# Patient Record
Sex: Female | Born: 1955 | Race: Asian | Hispanic: No | Marital: Married | Smoking: Never smoker
Health system: Southern US, Community
[De-identification: ages and names within clinical notes are randomized; demographics above are authoritative.]

## PROBLEM LIST (undated history)

## (undated) DIAGNOSIS — I10 Essential (primary) hypertension: Secondary | ICD-10-CM

---

## 2021-03-07 ENCOUNTER — Other Ambulatory Visit: Payer: Self-pay

## 2021-03-07 ENCOUNTER — Ambulatory Visit (INDEPENDENT_AMBULATORY_CARE_PROVIDER_SITE_OTHER): Payer: 59 | Admitting: Family Medicine

## 2021-03-07 ENCOUNTER — Encounter: Payer: Self-pay | Admitting: Family Medicine

## 2021-03-07 VITALS — BP 175/95 | HR 54 | Temp 98.0°F | Resp 16 | Ht 62.0 in | Wt 119.8 lb

## 2021-03-07 DIAGNOSIS — I10 Essential (primary) hypertension: Secondary | ICD-10-CM | POA: Diagnosis not present

## 2021-03-07 DIAGNOSIS — Z789 Other specified health status: Secondary | ICD-10-CM | POA: Diagnosis not present

## 2021-03-07 DIAGNOSIS — Z7689 Persons encountering health services in other specified circumstances: Secondary | ICD-10-CM

## 2021-03-07 MED ORDER — LOSARTAN POTASSIUM 50 MG PO TABS: 50.0000 mg | ORAL_TABLET | Freq: Every day | ORAL | 0 refills | Status: DC

## 2021-03-07 NOTE — Progress Notes (Signed)
   New Patient Office Visit  Subjective:  Patient ID: Catherine Goodwin, female    DOB: [birth year]  Age: 65 y.o. MRN: [id number]  CC:  Chief Complaint  Patient presents with   Establish Care   Annual Exam    HPI Catherine Goodwin presents for to establish care.  Patient recently moved to [place] to live with son.  Denies acute complaints or concerns.  This visit was aided by interpreter.  History reviewed. No pertinent past medical history.  History reviewed. No pertinent surgical history.  History reviewed. No pertinent family history.  Social History   Socioeconomic History   Marital status: Married    Spouse name: Not on file   Number of children: Not on file   Years of education: Not on file   Highest education level: Not on file  Occupational History   Not on file  Tobacco Use   Smoking status: Not on file   Smokeless tobacco: Not on file  Substance and Sexual Activity   Alcohol use: Not on file   Drug use: Not on file   Sexual activity: Not on file  Other Topics Concern   Not on file  Social History Narrative   Not on file   Social Determinants of Health   Financial Resource Strain: Not on file  Food Insecurity: Not on file  Transportation Needs: Not on file  Physical Activity: Not on file  Stress: Not on file  Social Connections: Not on file  Intimate Partner Violence: Not on file    ROS Review of Systems  All other systems reviewed and are negative.  Objective:   Today's Vitals: BP (!) 175/95 (BP Location: Right Arm, Patient Position: Sitting, Cuff Size: Normal)   Pulse (!) 54   Temp 98 F (36.7 C) (Oral)   Resp 16   Ht _0  (1.575 m)   Wt 119 lb 12.8 oz (54.3 kg)   SpO2 98%   BMI 21.91 kg/m   Physical Exam Vitals and nursing note reviewed.  Constitutional:      General: She is not in acute distress. Cardiovascular:     Rate and Rhythm: Normal rate and regular rhythm.  Pulmonary:     Effort: Pulmonary effort is normal.     Breath  sounds: Normal breath sounds.  Abdominal:     Palpations: Abdomen is soft.     Tenderness: There is no abdominal tenderness.  Musculoskeletal:     Right lower leg: No edema.     Left lower leg: No edema.  Neurological:     General: No focal deficit present.     Mental Status: She is alert and oriented to person, place, and time.    Assessment & Plan:   1. Essential hypertension Elevated reading.  Losartan 50 mg p.o. daily prescribed.  Monitoring labs ordered.  Will monitor. - CMP14+EGFR - Lipid Panel  2. Encounter to establish care   3. Language barrier affecting health care     Outpatient Encounter Medications as of 03/07/2021  Medication Sig   losartan (COZAAR) 50 MG tablet Take 1 tablet (50 mg total) by mouth daily.   No facility-administered encounter medications on file as of 03/07/2021.    Follow-up: Return in about 4 weeks (around 04/04/2021) for follow up.   Becky Sax, MD

## 2021-03-08 LAB — CMP14+EGFR
ALT: 18 IU/L (ref 0–32)
AST: 22 IU/L (ref 0–40)
Albumin/Globulin Ratio: 1.6 (ref 1.2–2.2)
Albumin: 4.6 g/dL (ref 3.8–4.8)
Alkaline Phosphatase: 114 IU/L (ref 44–121)
BUN/Creatinine Ratio: 19 (ref 12–28)
BUN: 15 mg/dL (ref 8–27)
Bilirubin Total: 0.9 mg/dL (ref 0.0–1.2)
CO2: 20 mmol/L (ref 20–29)
Calcium: 9.4 mg/dL (ref 8.7–10.3)
Chloride: 105 mmol/L (ref 96–106)
Creatinine, Ser: 0.8 mg/dL (ref 0.57–1.00)
Globulin, Total: 2.9 g/dL (ref 1.5–4.5)
Glucose: 88 mg/dL (ref 70–99)
Potassium: 4.1 mmol/L (ref 3.5–5.2)
Sodium: 147 mmol/L — ABNORMAL HIGH (ref 134–144)
Total Protein: 7.5 g/dL (ref 6.0–8.5)
eGFR: 82 mL/min/{1.73_m2} (ref >59)

## 2021-03-08 LAB — LIPID PANEL
Chol/HDL Ratio: 3.1 ratio (ref 0.0–4.4)
Cholesterol, Total: 186 mg/dL (ref 100–199)
HDL: 60 mg/dL (ref >39)
LDL Chol Calc (NIH): 104 mg/dL — ABNORMAL HIGH (ref 0–99)
Triglycerides: 127 mg/dL (ref 0–149)
VLDL Cholesterol Cal: 22 mg/dL (ref 5–40)

## 2021-04-04 ENCOUNTER — Other Ambulatory Visit: Payer: Self-pay

## 2021-04-04 ENCOUNTER — Encounter: Payer: Self-pay | Admitting: Family Medicine

## 2021-04-04 ENCOUNTER — Ambulatory Visit: Payer: 59 | Admitting: Family Medicine

## 2021-04-04 VITALS — BP 150/91 | HR 55 | Temp 97.9°F | Resp 16 | Wt 120.6 lb

## 2021-04-04 DIAGNOSIS — I10 Essential (primary) hypertension: Secondary | ICD-10-CM

## 2021-04-04 DIAGNOSIS — Z789 Other specified health status: Secondary | ICD-10-CM

## 2021-04-04 MED ORDER — LOSARTAN POTASSIUM 100 MG PO TABS: 100.0000 mg | ORAL_TABLET | Freq: Every day | ORAL | 0 refills | Status: DC

## 2021-04-04 NOTE — Progress Notes (Signed)
Patient is here for follow-up HTN  Patient has no new concerns today

## 2021-04-04 NOTE — Progress Notes (Signed)
   Established Patient Office Visit  Subjective:  Patient ID: Catherine Goodwin, female    DOB: 1956/02/10  Age: 65 y.o. MRN: [id number]  CC:  Chief Complaint  Patient presents with   Follow-up    HPI [name] presents for follow up of hypertension. Patient denies acute complaints or concerns. The visit was aided by an interpreter.   No past medical history on file.  No past surgical history on file.  No family history on file.  Social History   Socioeconomic History   Marital status: Married    Spouse name: Not on file   Number of children: Not on file   Years of education: Not on file   Highest education level: Not on file  Occupational History   Not on file  Tobacco Use   Smoking status: Not on file   Smokeless tobacco: Not on file  Substance and Sexual Activity   Alcohol use: Not on file   Drug use: Not on file   Sexual activity: Not on file  Other Topics Concern   Not on file  Social History Narrative   Not on file   Social Determinants of Health   Financial Resource Strain: Not on file  Food Insecurity: Not on file  Transportation Needs: Not on file  Physical Activity: Not on file  Stress: Not on file  Social Connections: Not on file  Intimate Partner Violence: Not on file    ROS Review of Systems  All other systems reviewed and are negative.  Objective:   Today's Vitals: BP (!) 150/91   Pulse (!) 55   Temp 97.9 F (36.6 C) (Oral)   Resp 16   Wt 120 lb 9.6 oz (54.7 kg)   BMI 22.06 kg/m   Physical Exam Vitals and nursing note reviewed.  Constitutional:      General: She is not in acute distress. Cardiovascular:     Rate and Rhythm: Normal rate and regular rhythm.  Pulmonary:     Effort: Pulmonary effort is normal.     Breath sounds: Normal breath sounds.  Abdominal:     Palpations: Abdomen is soft.     Tenderness: There is no abdominal tenderness.  Musculoskeletal:     Right lower leg: No edema.     Left lower leg: No edema.   Neurological:     General: No focal deficit present.     Mental Status: She is alert and oriented to person, place, and time.    Assessment & Plan:   1. Essential hypertension Readings are improved but remain elevated. Will increase losartan from 50 mg daily to 100mg  daily and monitor.   2. Language barrier to communication     Outpatient Encounter Medications as of 04/04/2021  Medication Sig   losartan (COZAAR) 100 MG tablet Take 1 tablet (100 mg total) by mouth daily.   [DISCONTINUED] losartan (COZAAR) 50 MG tablet Take 1 tablet (50 mg total) by mouth daily.   No facility-administered encounter medications on file as of 04/04/2021.    Follow-up: Return in about 4 weeks (around 05/02/2021) for follow up.   05/04/2021, MD

## 2021-05-02 ENCOUNTER — Ambulatory Visit (INDEPENDENT_AMBULATORY_CARE_PROVIDER_SITE_OTHER): Payer: 59 | Admitting: Family Medicine

## 2021-05-02 ENCOUNTER — Other Ambulatory Visit: Payer: Self-pay

## 2021-05-02 ENCOUNTER — Encounter: Payer: Self-pay | Admitting: Family Medicine

## 2021-05-02 VITALS — BP 144/89 | HR 57 | Temp 98.0°F | Resp 16 | Wt 120.6 lb

## 2021-05-02 DIAGNOSIS — I10 Essential (primary) hypertension: Secondary | ICD-10-CM | POA: Diagnosis not present

## 2021-05-02 DIAGNOSIS — R42 Dizziness and giddiness: Secondary | ICD-10-CM

## 2021-05-02 DIAGNOSIS — Z789 Other specified health status: Secondary | ICD-10-CM

## 2021-05-02 DIAGNOSIS — R001 Bradycardia, unspecified: Secondary | ICD-10-CM

## 2021-05-02 NOTE — Progress Notes (Signed)
Patient son said mom has been feeling dizzy lately and would like to talk with provider about this. Patient son said that mom has been having heart discomfort but denies chest pain

## 2021-05-02 NOTE — Progress Notes (Signed)
   Established Patient Office Visit  Subjective:  Patient ID: Catherine Goodwin, female    DOB: 1955/12/29  Age: 65 y.o. MRN: [id number]  CC:  Chief Complaint  Patient presents with   Follow-up    HPI Catherine Goodwin presents for follow up of hypertension. Patient has also reports some intermittent "chest fullness" and some dizziness. This visit was aided by an interpreter.   No past medical history on file.  No past surgical history on file.  No family history on file.  Social History   Socioeconomic History   Marital status: Married    Spouse name: Not on file   Number of children: Not on file   Years of education: Not on file   Highest education level: Not on file  Occupational History   Not on file  Tobacco Use   Smoking status: Not on file   Smokeless tobacco: Not on file  Substance and Sexual Activity   Alcohol use: Not on file   Drug use: Not on file   Sexual activity: Not on file  Other Topics Concern   Not on file  Social History Narrative   Not on file   Social Determinants of Health   Financial Resource Strain: Not on file  Food Insecurity: Not on file  Transportation Needs: Not on file  Physical Activity: Not on file  Stress: Not on file  Social Connections: Not on file  Intimate Partner Violence: Not on file    ROS Review of Systems  Cardiovascular:  Negative for chest pain and leg swelling.  Neurological:  Positive for dizziness. Negative for syncope.  All other systems reviewed and are negative.  Objective:   Today's Vitals: BP (!) 144/89   Pulse (!) 57   Temp 98 F (36.7 C) (Oral)   Resp 16   Wt 120 lb 9.6 oz (54.7 kg)   SpO2 97%   BMI 22.06 kg/m   Physical Exam Vitals and nursing note reviewed.  Constitutional:      General: She is not in acute distress. Cardiovascular:     Rate and Rhythm: Normal rate and regular rhythm.  Pulmonary:     Effort: Pulmonary effort is normal.     Breath sounds: Normal breath sounds.  Abdominal:      Palpations: Abdomen is soft.     Tenderness: There is no abdominal tenderness.  Musculoskeletal:     Right lower leg: No edema.     Left lower leg: No edema.  Neurological:     General: No focal deficit present.     Mental Status: She is alert and oriented to person, place, and time.    Assessment & Plan:   1. Essential hypertension continue present management - referral to cardiology for further eval/mgt - Ambulatory referral to Cardiology  2. Bradycardia As above - Ambulatory referral to Cardiology  3. Dizziness and giddiness ? 2/2 to above - referral as referenced  4. Language barrier to communication     Outpatient Encounter Medications as of 05/02/2021  Medication Sig   losartan (COZAAR) 100 MG tablet Take 1 tablet (100 mg total) by mouth daily.   No facility-administered encounter medications on file as of 05/02/2021.    Follow-up: Return in about 4 weeks (around 05/30/2021) for follow up.   Tommie Raymond, MD

## 2021-05-09 ENCOUNTER — Emergency Department (HOSPITAL_BASED_OUTPATIENT_CLINIC_OR_DEPARTMENT_OTHER)
Admission: EM | Admit: 2021-05-09 | Discharge: 2021-05-10 | Disposition: A | Discharge status: Home / Self Care | Payer: 59 | Attending: Emergency Medicine | Admitting: Emergency Medicine

## 2021-05-09 ENCOUNTER — Ambulatory Visit
Admission: EM | Admit: 2021-05-09 | Discharge: 2021-05-09 | Disposition: A | Discharge status: Home / Self Care | Payer: 59 | Attending: Internal Medicine | Admitting: Internal Medicine

## 2021-05-09 ENCOUNTER — Encounter: Payer: Self-pay | Admitting: Emergency Medicine

## 2021-05-09 ENCOUNTER — Encounter (HOSPITAL_BASED_OUTPATIENT_CLINIC_OR_DEPARTMENT_OTHER): Payer: Self-pay | Admitting: Emergency Medicine

## 2021-05-09 ENCOUNTER — Other Ambulatory Visit: Payer: Self-pay

## 2021-05-09 DIAGNOSIS — R112 Nausea with vomiting, unspecified: Secondary | ICD-10-CM

## 2021-05-09 DIAGNOSIS — R42 Dizziness and giddiness: Secondary | ICD-10-CM

## 2021-05-09 DIAGNOSIS — I10 Essential (primary) hypertension: Secondary | ICD-10-CM | POA: Diagnosis not present

## 2021-05-09 DIAGNOSIS — Z79899 Other long term (current) drug therapy: Secondary | ICD-10-CM | POA: Diagnosis not present

## 2021-05-09 DIAGNOSIS — Z20822 Contact with and (suspected) exposure to covid-19: Secondary | ICD-10-CM | POA: Insufficient documentation

## 2021-05-09 HISTORY — DX: Essential (primary) hypertension: I10

## 2021-05-09 LAB — CBC
HCT: 40.9 % (ref 36.0–46.0)
Hemoglobin: 13.6 g/dL (ref 12.0–15.0)
MCH: 29 pg (ref 26.0–34.0)
MCHC: 33.3 g/dL (ref 30.0–36.0)
MCV: 87.2 fL (ref 80.0–100.0)
Platelets: 279 10*3/uL (ref 150–400)
RBC: 4.69 MIL/uL (ref 3.87–5.11)
RDW: 12.2 % (ref 11.5–15.5)
WBC: 6.7 10*3/uL (ref 4.0–10.5)
nRBC: 0 % (ref 0.0–0.2)

## 2021-05-09 LAB — BASIC METABOLIC PANEL
Anion gap: 8 (ref 5–15)
BUN: 14 mg/dL (ref 8–23)
CO2: 27 mmol/L (ref 22–32)
Calcium: 9.8 mg/dL (ref 8.9–10.3)
Chloride: 103 mmol/L (ref 98–111)
Creatinine, Ser: 0.63 mg/dL (ref 0.44–1.00)
GFR, Estimated: >60 mL/min (ref >60)
Glucose, Bld: 120 mg/dL — ABNORMAL HIGH (ref 70–99)
Potassium: 3.9 mmol/L (ref 3.5–5.1)
Sodium: 138 mmol/L (ref 135–145)

## 2021-05-09 LAB — RESP PANEL BY RT-PCR (FLU A&B, COVID) ARPGX2
Influenza A by PCR: NEGATIVE
Influenza B by PCR: NEGATIVE
SARS Coronavirus 2 by RT PCR: NEGATIVE

## 2021-05-09 LAB — POCT FASTING CBG KUC MANUAL ENTRY: POCT Glucose (KUC): 117 mg/dL — AB (ref 70–99)

## 2021-05-09 NOTE — Discharge Instructions (Signed)
Please go to the emergency department as soon as you leave urgent care for further evaluation and management. ?

## 2021-05-09 NOTE — ED Triage Notes (Signed)
Pt speaks mandarin - fujo dialect. Interpreter speaks mandarin.   Pt reports dizziness when she awakened this morning and then she began vomiting.   Pt is still feeling dizzy; last emesis was at 1600.

## 2021-05-09 NOTE — ED Provider Notes (Signed)
[facility]    CSN: [id number] Arrival date & time: 05/09/21  1521      History   Chief Complaint Chief Complaint  Patient presents with   Dizziness    HPI Catherine Goodwin is a 65 y.o. female.   Patient presents with dizziness, nausea, vomiting that started yesterday around 5 AM.  Dizziness is constant and there are no aggravating factors.  Patient denies any upper respiratory symptoms, diarrhea, constipation, fever, urinary burning, urinary frequency, abdominal pain, chest pain, shortness of breath.  Denies any known sick contacts.  Patient was seen on 05/02/2021 and reported dizziness to PCP.  PCP referred patient to cardiology but patient has not yet been seen by cardiologist.  Current medical history includes hypertension where patient takes Cozaar daily.   Dizziness  History reviewed. No pertinent past medical history.  There are no problems to display for this patient.   History reviewed. No pertinent surgical history.  OB History   No obstetric history on file.      Home Medications    Prior to Admission medications   Medication Sig Start Date End Date Taking? Authorizing Provider  losartan (COZAAR) 100 MG tablet Take 1 tablet (100 mg total) by mouth daily. 04/04/21   Georganna Skeans, MD    Family History History reviewed. No pertinent family history.  Social History Social History   Tobacco Use   Smoking status: Never   Smokeless tobacco: Never     Allergies   Patient has no known allergies.   Review of Systems Review of Systems Per HPI  Physical Exam Triage Vital Signs ED Triage Vitals  Enc Vitals Group     BP 05/09/21 1530 (!) 167/93     Pulse Rate 05/09/21 1530 61     Resp 05/09/21 1530 16     Temp 05/09/21 1530 98.6 F (37 C)     Temp Source 05/09/21 1530 Oral     SpO2 05/09/21 1530 93 %     Weight --      Height --      Head Circumference --      Peak Flow --      Pain Score 05/09/21 1533 0     Pain Loc --       Pain Edu? --      Excl. in GC? --    No data found.  Updated Vital Signs BP (!) 167/93 (BP Location: Right Arm)   Pulse 61   Temp 98.6 F (37 C) (Oral)   Resp 16   SpO2 93%   Visual Acuity Right Eye Distance:   Left Eye Distance:   Bilateral Distance:    Right Eye Near:   Left Eye Near:    Bilateral Near:     Physical Exam Constitutional:      General: She is not in acute distress.    Appearance: Normal appearance. She is not toxic-appearing or diaphoretic.  HENT:     Head: Normocephalic and atraumatic.     Right Ear: Tympanic membrane and ear canal normal.     Left Ear: Tympanic membrane and ear canal normal.     Nose: Nose normal.     Mouth/Throat:     Mouth: Mucous membranes are moist.  Eyes:     Extraocular Movements: Extraocular movements intact.     Conjunctiva/sclera: Conjunctivae normal.     Pupils: Pupils are equal, round, and reactive to light.  Cardiovascular:     Rate and Rhythm: Regular rhythm. Bradycardia  present.     Pulses: Normal pulses.     Heart sounds: Normal heart sounds.  Pulmonary:     Effort: Pulmonary effort is normal. No respiratory distress.     Breath sounds: Normal breath sounds.  Abdominal:     General: Bowel sounds are normal. There is no distension.     Palpations: Abdomen is soft.     Tenderness: There is no abdominal tenderness.  Skin:    General: Skin is warm and dry.  Neurological:     General: No focal deficit present.     Mental Status: She is alert and oriented to person, place, and time. Mental status is at baseline.     Cranial Nerves: Cranial nerves 2-12 are intact.     Sensory: Sensation is intact.     Motor: Motor function is intact.     Coordination: Coordination is intact.     Gait: Gait is intact.  Psychiatric:        Mood and Affect: Mood normal.        Behavior: Behavior normal.        Thought Content: Thought content normal.        Judgment: Judgment normal.     UC Treatments / Results  Labs (all  labs ordered are listed, but only abnormal results are displayed) Labs Reviewed  POCT FASTING CBG [facility] MANUAL ENTRY - Abnormal; Notable for the following components:      Result Value   POCT Glucose ([facility]) 117 (*)    All other components within normal limits    EKG   Radiology No results found.  Procedures Procedures (including critical care time)  Medications Ordered in UC Medications - No data to display  Initial Impression / Assessment and Plan / UC Course  I have reviewed the triage vital signs and the nursing notes.  Pertinent labs & imaging results that were available during my care of the patient were reviewed by me and considered in my medical decision making (see chart for details).     Differential diagnoses include bradycardia, vertigo, urinary tract infection.  EKG showing sinus rhythm with nonspecific ST and T wave abnormality.  No prior EKGs to compare to.  It would be best for patient to be further evaluated and managed at the hospital given EKG result to rule out cardiac cause of dizziness.  Will defer all other evaluation and management to the hospital.  Suggested EMS transport to family number and patient but they declined.  Patient and family member agreed to family member transporting patient to the hospital.  Interpreter used throughout patient interaction. Final Clinical Impressions(s) / UC Diagnoses   Final diagnoses:  Dizziness and giddiness  Nausea and vomiting, unspecified vomiting type     Discharge Instructions      Please go to the emergency department as soon as you leave urgent care for further evaluation and management.    ED Prescriptions   None    PDMP not reviewed this encounter.   Gustavus Bryant, [place] 05/09/21 [phone]

## 2021-05-09 NOTE — ED Triage Notes (Addendum)
Dizziness, nausea, vomiting, mild ringing in ears starting yesterday at 5 pm. Reports history of heart problems. Denies pain, fever, diarrhea, headache, heart problems

## 2021-05-10 ENCOUNTER — Emergency Department (HOSPITAL_BASED_OUTPATIENT_CLINIC_OR_DEPARTMENT_OTHER): Payer: 59

## 2021-05-10 ENCOUNTER — Emergency Department (HOSPITAL_COMMUNITY): Payer: 59

## 2021-05-10 ENCOUNTER — Encounter (HOSPITAL_BASED_OUTPATIENT_CLINIC_OR_DEPARTMENT_OTHER): Payer: Self-pay

## 2021-05-10 DIAGNOSIS — Z20822 Contact with and (suspected) exposure to covid-19: Secondary | ICD-10-CM | POA: Diagnosis not present

## 2021-05-10 DIAGNOSIS — R112 Nausea with vomiting, unspecified: Secondary | ICD-10-CM | POA: Diagnosis not present

## 2021-05-10 DIAGNOSIS — I10 Essential (primary) hypertension: Secondary | ICD-10-CM | POA: Diagnosis not present

## 2021-05-10 DIAGNOSIS — Z79899 Other long term (current) drug therapy: Secondary | ICD-10-CM | POA: Diagnosis not present

## 2021-05-10 DIAGNOSIS — R42 Dizziness and giddiness: Secondary | ICD-10-CM | POA: Diagnosis present

## 2021-05-10 LAB — TROPONIN I (HIGH SENSITIVITY)
Troponin I (High Sensitivity): 4 ng/L (ref <18)
Troponin I (High Sensitivity): 4 ng/L (ref <18)

## 2021-05-10 LAB — URINALYSIS, ROUTINE W REFLEX MICROSCOPIC
Bilirubin Urine: NEGATIVE
Glucose, UA: NEGATIVE mg/dL
Hgb urine dipstick: NEGATIVE
Ketones, ur: 15 mg/dL — AB
Leukocytes,Ua: NEGATIVE
Nitrite: NEGATIVE
Specific Gravity, Urine: 1.015 (ref 1.005–1.030)
pH: 6.5 (ref 5.0–8.0)

## 2021-05-10 MED ORDER — MECLIZINE HCL 25 MG PO TABS: 25.0000 mg | ORAL_TABLET | Freq: Three times a day (TID) | ORAL | 0 refills | Status: DC | PRN

## 2021-05-10 MED ORDER — MECLIZINE HCL 25 MG PO TABS
ORAL_TABLET | ORAL | Status: AC
  Filled 2021-05-10: qty 1

## 2021-05-10 MED ORDER — IOHEXOL 350 MG/ML SOLN
80.0000 mL | Freq: Once | INTRAVENOUS | Status: AC | PRN
  Administered 2021-05-10: 80 mL via INTRAVENOUS

## 2021-05-10 MED ORDER — MECLIZINE HCL 25 MG PO TABS
25.0000 mg | ORAL_TABLET | Freq: Once | ORAL | Status: AC
  Administered 2021-05-10: 25 mg via ORAL

## 2021-05-10 NOTE — ED Notes (Signed)
RN called MRI again to check on scan status. MRI to sent transport now per MRI.

## 2021-05-10 NOTE — ED Notes (Signed)
RN requested update about timing of MRI. Per MRI, pt will hopefully go in the next hour.

## 2021-05-10 NOTE — ED Notes (Signed)
Son called about pt pending d/c per PA Greta Doom.

## 2021-05-10 NOTE — ED Notes (Signed)
Pt ambulated to RR with minimal assistance 

## 2021-05-10 NOTE — ED Notes (Signed)
Patient verbalizes understanding of discharge instructions. Opportunity for questioning and answers were provided. Armband removed by staff, pt discharged from ED via wheelchair to lobby to await family to go home.   

## 2021-05-10 NOTE — ED Notes (Signed)
Pt transfer from [facility]. Pt was having dizziness with vomiting. Pt last vomited at 1600. Pt had meclizine at [facility], which helped a little. Pt here for MRI.   180/97 62 heart rate 98% room air

## 2021-05-10 NOTE — ED Notes (Signed)
[id number] interpreter for IV start and blood draw

## 2021-05-10 NOTE — Discharge Instructions (Addendum)
Your dizziness is likely due to a conditional called Benign Paroxysmal Positional Vertigo (BPPV). Please take meclizine as prescribed for dizziness.  Follow up closely with your primary care provider for further management.  You will likely benefit from vestibular rehab such as following the Epley Maneuver (you can search for this on YouTube).  Discuss with your doctor for further recommendation.  No evidence of stroke on today's exam.    ???????????????????????? (BPPV) ???????????????meclizine???????????????????????????????????????? Epley Maneuver????? YouTube ??????????????????????????????????? Nn de tuy?n h?n k?nng sh yuy y? zh?ng ch?ng wi lingxng zhn f? xng wizh xng xuny?n (BPPV) de bngzhng y?nq? de. Tuy?n q?ng n ch?f?ng fyng meclizine. Y? nn de ch?j b?ojin tg?ng zh? mqi g?n jn y? jnxng jny?b gu?nl?. Nn k?nng hu shuy y qintng k?ngf, lr z?nxn Epley Maneuver(nn k?y? zi YouTube shng s?usu?). Y? nn de y?sh?ng t?oln y? hud jny?b de jiny. J?nti?n de ji?nch miy?u zhngf?ng de j?xing.

## 2021-05-10 NOTE — ED Notes (Signed)
Pt transported to MRI 

## 2021-05-10 NOTE — ED Provider Notes (Signed)
[facility] EMERGENCY DEPT Provider Note   CSN: [id number] Arrival date & time: 05/09/21  1748     History Chief Complaint  Patient presents with   Dizziness   Emesis    Catherine Goodwin is a 65 y.o. female.  The history is provided by the patient and a relative. The history is limited by a language barrier. A language interpreter was used.  Dizziness Quality:  Head spinning Severity:  Moderate Onset quality:  Gradual Duration:  1 day Timing:  Constant Progression:  Worsening Chronicity:  New Context: not with head movement and not with loss of consciousness   Relieved by:  Nothing Worsened by:  Nothing Ineffective treatments:  None tried Associated symptoms: nausea and vomiting   Associated symptoms: no diarrhea and no weakness   Risk factors: no hx of vertigo   Emesis Severity:  Mild Timing:  Rare Quality:  Stomach contents Progression:  Resolved Chronicity:  New Recent urination:  Normal Relieved by:  Nothing Worsened by:  Nothing Ineffective treatments:  None tried Associated symptoms: no abdominal pain, no diarrhea and no fever   Risk factors: no alcohol use       Past Medical History:  Diagnosis Date   Hypertension     There are no problems to display for this patient.   History reviewed. No pertinent surgical history.   OB History   No obstetric history on file.     History reviewed. No pertinent family history.  Social History   Tobacco Use   Smoking status: Never   Smokeless tobacco: Never  Vaping Use   Vaping Use: Never used  Substance Use Topics   Alcohol use: Never   Drug use: Never    Home Medications Prior to Admission medications   Medication Sig Start Date End Date Taking? Authorizing Provider  losartan (COZAAR) 100 MG tablet Take 1 tablet (100 mg total) by mouth daily. 04/04/21   Georganna Skeans, MD    Allergies    Patient has no known allergies.  Review of Systems   Review of Systems  Constitutional:   Negative for fever.  HENT:  Negative for facial swelling.   Eyes:  Negative for redness.  Respiratory:  Negative for wheezing and stridor.   Cardiovascular:  Negative for leg swelling.  Gastrointestinal:  Positive for nausea and vomiting. Negative for abdominal pain and diarrhea.  Genitourinary:  Negative for difficulty urinating.  Musculoskeletal:  Negative for neck pain.  Skin:  Negative for rash.  Neurological:  Positive for dizziness. Negative for weakness.  Psychiatric/Behavioral:  Negative for agitation.   All other systems reviewed and are negative.  Physical Exam Updated Vital Signs BP (!) 175/84   Pulse (!) 54   Temp 98.7 F (37.1 C) (Oral)   Resp 20   Ht 5' (1.524 m)   Wt 57.2 kg   SpO2 100%   BMI 24.61 kg/m   Physical Exam Vitals and nursing note reviewed.  Constitutional:      General: She is not in acute distress.    Appearance: Normal appearance. She is not diaphoretic.  HENT:     Head: Normocephalic and atraumatic.     Nose: Nose normal.     Mouth/Throat:     Mouth: Mucous membranes are moist.  Eyes:     Extraocular Movements: Extraocular movements intact.     Conjunctiva/sclera: Conjunctivae normal.     Pupils: Pupils are equal, round, and reactive to light.  Cardiovascular:     Rate and Rhythm: Normal  rate and regular rhythm.     Pulses: Normal pulses.     Heart sounds: Normal heart sounds.  Pulmonary:     Effort: Pulmonary effort is normal.     Breath sounds: Normal breath sounds.  Abdominal:     General: Abdomen is flat. Bowel sounds are normal.     Palpations: Abdomen is soft.     Tenderness: There is no abdominal tenderness. There is no guarding.  Musculoskeletal:        General: Normal range of motion.     Cervical back: Normal range of motion and neck supple.  Skin:    General: Skin is warm and dry.     Capillary Refill: Capillary refill takes less than 2 seconds.  Neurological:     Mental Status: She is alert.     Cranial Nerves: No  cranial nerve deficit.     Deep Tendon Reflexes: Reflexes normal.  Psychiatric:        Mood and Affect: Mood normal.    ED Results / Procedures / Treatments   Labs (all labs ordered are listed, but only abnormal results are displayed) Results for orders placed or performed during the hospital encounter of 05/09/21  Resp Panel by RT-PCR (Flu A&B, Covid) Nasopharyngeal Swab   Specimen: Nasopharyngeal Swab; Nasopharyngeal(NP) swabs in vial transport medium  Result Value Ref Range   SARS Coronavirus 2 by RT PCR NEGATIVE NEGATIVE   Influenza A by PCR NEGATIVE NEGATIVE   Influenza B by PCR NEGATIVE NEGATIVE  Basic metabolic panel  Result Value Ref Range   Sodium 138 135 - 145 mmol/L   Potassium 3.9 3.5 - 5.1 mmol/L   Chloride 103 98 - 111 mmol/L   CO2 27 22 - 32 mmol/L   Glucose, Bld 120 (H) 70 - 99 mg/dL   BUN 14 8 - 23 mg/dL   Creatinine, Ser 1.93 0.44 - 1.00 mg/dL   Calcium 9.8 8.9 - 79.0 mg/dL   GFR, Estimated >24 >09 mL/min   Anion gap 8 5 - 15  CBC  Result Value Ref Range   WBC 6.7 4.0 - 10.5 K/uL   RBC 4.69 3.87 - 5.11 MIL/uL   Hemoglobin 13.6 12.0 - 15.0 g/dL   HCT 73.5 32.9 - 92.4 %   MCV 87.2 80.0 - 100.0 fL   MCH 29.0 26.0 - 34.0 pg   MCHC 33.3 30.0 - 36.0 g/dL   RDW 26.8 34.1 - 96.2 %   Platelets 279 150 - 400 K/uL   nRBC 0.0 0.0 - 0.2 %  Urinalysis, Routine w reflex microscopic  Result Value Ref Range   Color, Urine YELLOW YELLOW   APPearance CLEAR CLEAR   Specific Gravity, Urine 1.015 1.005 - 1.030   pH 6.5 5.0 - 8.0   Glucose, UA NEGATIVE NEGATIVE mg/dL   Hgb urine dipstick NEGATIVE NEGATIVE   Bilirubin Urine NEGATIVE NEGATIVE   Ketones, ur 15 (A) NEGATIVE mg/dL   Protein, ur TRACE (A) NEGATIVE mg/dL   Nitrite NEGATIVE NEGATIVE   Leukocytes,Ua NEGATIVE NEGATIVE  Troponin I (High Sensitivity)  Result Value Ref Range   Troponin I (High Sensitivity) 4 <18 ng/L   CT ANGIO HEAD NECK W WO CM  Result Date: 05/10/2021 CLINICAL DATA:  Trauma EXAM: CT  ANGIOGRAPHY HEAD AND NECK TECHNIQUE: Multidetector CT imaging of the head and neck was performed using the standard protocol during bolus administration of intravenous contrast. Multiplanar CT image reconstructions and MIPs were obtained to evaluate the vascular anatomy. Carotid stenosis  measurements (when applicable) are obtained utilizing NASCET criteria, using the distal internal carotid diameter as the denominator. CONTRAST:  38mL OMNIPAQUE IOHEXOL 350 MG/ML SOLN COMPARISON:  None. FINDINGS: CT HEAD FINDINGS Brain: There is no mass, hemorrhage or extra-axial collection. The size and configuration of the ventricles and extra-axial CSF spaces are normal. There is no acute or chronic infarction. The brain parenchyma is normal. Skull: The visualized skull base, calvarium and extracranial soft tissues are normal. Sinuses/Orbits: No fluid levels or advanced mucosal thickening of the visualized paranasal sinuses. No mastoid or middle ear effusion. The orbits are normal. CTA NECK FINDINGS SKELETON: There is no bony spinal canal stenosis. No lytic or blastic lesion. OTHER NECK: Normal pharynx, larynx and major salivary glands. No cervical lymphadenopathy. Unremarkable thyroid gland. UPPER CHEST: No pneumothorax or pleural effusion. No nodules or masses. AORTIC ARCH: There is no calcific atherosclerosis of the aortic arch. There is no aneurysm, dissection or hemodynamically significant stenosis of the visualized portion of the aorta. Conventional 3 vessel aortic branching pattern. The visualized proximal subclavian arteries are widely patent. RIGHT CAROTID SYSTEM: Normal without aneurysm, dissection or stenosis. LEFT CAROTID SYSTEM: Normal without aneurysm, dissection or stenosis. VERTEBRAL ARTERIES: Left dominant configuration. Both origins are clearly patent. There is no dissection, occlusion or flow-limiting stenosis to the skull base (V1-V3 segments). CTA HEAD FINDINGS POSTERIOR CIRCULATION: --Vertebral arteries:  Normal V4 segments. --Inferior cerebellar arteries: Normal. --Basilar artery: Normal. --Superior cerebellar arteries: Normal. --Posterior cerebral arteries (PCA): Normal. ANTERIOR CIRCULATION: --Intracranial internal carotid arteries: Normal. --Anterior cerebral arteries (ACA): Normal. Both A1 segments are present. Patent anterior communicating artery (a-comm). --Middle cerebral arteries (MCA): Normal. VENOUS SINUSES: As permitted by contrast timing, patent. ANATOMIC VARIANTS: None Review of the MIP images confirms the above findings. IMPRESSION: Normal CTA of the head and neck. Electronically Signed   By: Deatra Robinson M.D.   On: 05/10/2021 01:05    EKG None  Radiology CT ANGIO HEAD NECK W WO CM  Result Date: 05/10/2021 CLINICAL DATA:  Trauma EXAM: CT ANGIOGRAPHY HEAD AND NECK TECHNIQUE: Multidetector CT imaging of the head and neck was performed using the standard protocol during bolus administration of intravenous contrast. Multiplanar CT image reconstructions and MIPs were obtained to evaluate the vascular anatomy. Carotid stenosis measurements (when applicable) are obtained utilizing NASCET criteria, using the distal internal carotid diameter as the denominator. CONTRAST:  19mL OMNIPAQUE IOHEXOL 350 MG/ML SOLN COMPARISON:  None. FINDINGS: CT HEAD FINDINGS Brain: There is no mass, hemorrhage or extra-axial collection. The size and configuration of the ventricles and extra-axial CSF spaces are normal. There is no acute or chronic infarction. The brain parenchyma is normal. Skull: The visualized skull base, calvarium and extracranial soft tissues are normal. Sinuses/Orbits: No fluid levels or advanced mucosal thickening of the visualized paranasal sinuses. No mastoid or middle ear effusion. The orbits are normal. CTA NECK FINDINGS SKELETON: There is no bony spinal canal stenosis. No lytic or blastic lesion. OTHER NECK: Normal pharynx, larynx and major salivary glands. No cervical lymphadenopathy.  Unremarkable thyroid gland. UPPER CHEST: No pneumothorax or pleural effusion. No nodules or masses. AORTIC ARCH: There is no calcific atherosclerosis of the aortic arch. There is no aneurysm, dissection or hemodynamically significant stenosis of the visualized portion of the aorta. Conventional 3 vessel aortic branching pattern. The visualized proximal subclavian arteries are widely patent. RIGHT CAROTID SYSTEM: Normal without aneurysm, dissection or stenosis. LEFT CAROTID SYSTEM: Normal without aneurysm, dissection or stenosis. VERTEBRAL ARTERIES: Left dominant configuration. Both origins are clearly patent. There  is no dissection, occlusion or flow-limiting stenosis to the skull base (V1-V3 segments). CTA HEAD FINDINGS POSTERIOR CIRCULATION: --Vertebral arteries: Normal V4 segments. --Inferior cerebellar arteries: Normal. --Basilar artery: Normal. --Superior cerebellar arteries: Normal. --Posterior cerebral arteries (PCA): Normal. ANTERIOR CIRCULATION: --Intracranial internal carotid arteries: Normal. --Anterior cerebral arteries (ACA): Normal. Both A1 segments are present. Patent anterior communicating artery (a-comm). --Middle cerebral arteries (MCA): Normal. VENOUS SINUSES: As permitted by contrast timing, patent. ANATOMIC VARIANTS: None Review of the MIP images confirms the above findings. IMPRESSION: Normal CTA of the head and neck. Electronically Signed   By: Deatra Robinson M.D.   On: 05/10/2021 01:05    Procedures Procedures   Medications Ordered in ED Medications  meclizine (ANTIVERT) tablet 25 mg (25 mg Oral Given 05/10/21 0009)  iohexol (OMNIPAQUE) 350 MG/ML injection 80 mL (80 mLs Intravenous Contrast Given 05/10/21 0032)    ED Course  I have reviewed the triage vital signs and the nursing notes.  Pertinent labs & imaging results that were available during my care of the patient were reviewed by me and considered in my medical decision making (see chart for details).  138 case d/w Dr.  Wilford Corner, will need MRI.  If negative d/c with vestibular rehab.  If positive, only then needs admission  Accepted by [facility] to the ED for MRI  MRI has been ordered  Final Clinical Impression(s) / ED Diagnoses Final diagnoses:  None   To [facility] for MRI Rx / DC Orders ED Discharge Orders     None        Money Mckeithan, MD 05/10/21 5427

## 2021-05-10 NOTE — ED Provider Notes (Signed)
Received signout from previous providers, please see her note for complete H&P.  This is a 65 year old Asian female presenting with dizziness.  Neurology was consulted, brain MRI was ordered.  Fortunately MRI did not show any concerning finding.  Patient able to ambulate several times throughout the ER visit with improvement of her symptoms.  Suspect BPPV causing her symptoms, will discharge home with vestibular rehab, and meclizine for symptom control.  Outpatient follow-up recommended.  Information was discussed using a language interpreter.    BP (!) 135/93   Pulse 63   Temp 99 F (37.2 C) (Oral)   Resp 16   Ht 5' (1.524 m)   Wt 57.2 kg   SpO2 97%   BMI 24.61 kg/m   Results for orders placed or performed during the hospital encounter of 05/09/21  Resp Panel by RT-PCR (Flu A&B, Covid) Nasopharyngeal Swab   Specimen: Nasopharyngeal Swab; Nasopharyngeal(NP) swabs in vial transport medium  Result Value Ref Range   SARS Coronavirus 2 by RT PCR NEGATIVE NEGATIVE   Influenza A by PCR NEGATIVE NEGATIVE   Influenza B by PCR NEGATIVE NEGATIVE  Basic metabolic panel  Result Value Ref Range   Sodium 138 135 - 145 mmol/L   Potassium 3.9 3.5 - 5.1 mmol/L   Chloride 103 98 - 111 mmol/L   CO2 27 22 - 32 mmol/L   Glucose, Bld 120 (H) 70 - 99 mg/dL   BUN 14 8 - 23 mg/dL   Creatinine, Ser 5.36 0.44 - 1.00 mg/dL   Calcium 9.8 8.9 - 14.4 mg/dL   GFR, Estimated >31 >54 mL/min   Anion gap 8 5 - 15  CBC  Result Value Ref Range   WBC 6.7 4.0 - 10.5 K/uL   RBC 4.69 3.87 - 5.11 MIL/uL   Hemoglobin 13.6 12.0 - 15.0 g/dL   HCT 00.8 67.6 - 19.5 %   MCV 87.2 80.0 - 100.0 fL   MCH 29.0 26.0 - 34.0 pg   MCHC 33.3 30.0 - 36.0 g/dL   RDW 09.3 26.7 - 12.4 %   Platelets 279 150 - 400 K/uL   nRBC 0.0 0.0 - 0.2 %  Urinalysis, Routine w reflex microscopic  Result Value Ref Range   Color, Urine YELLOW YELLOW   APPearance CLEAR CLEAR   Specific Gravity, Urine 1.015 1.005 - 1.030   pH 6.5 5.0 - 8.0    Glucose, UA NEGATIVE NEGATIVE mg/dL   Hgb urine dipstick NEGATIVE NEGATIVE   Bilirubin Urine NEGATIVE NEGATIVE   Ketones, ur 15 (A) NEGATIVE mg/dL   Protein, ur TRACE (A) NEGATIVE mg/dL   Nitrite NEGATIVE NEGATIVE   Leukocytes,Ua NEGATIVE NEGATIVE  Troponin I (High Sensitivity)  Result Value Ref Range   Troponin I (High Sensitivity) 4 <18 ng/L  Troponin I (High Sensitivity)  Result Value Ref Range   Troponin I (High Sensitivity) 4 <18 ng/L   CT ANGIO HEAD NECK W WO CM  Result Date: 05/10/2021 CLINICAL DATA:  Trauma EXAM: CT ANGIOGRAPHY HEAD AND NECK TECHNIQUE: Multidetector CT imaging of the head and neck was performed using the standard protocol during bolus administration of intravenous contrast. Multiplanar CT image reconstructions and MIPs were obtained to evaluate the vascular anatomy. Carotid stenosis measurements (when applicable) are obtained utilizing NASCET criteria, using the distal internal carotid diameter as the denominator. CONTRAST:  22mL OMNIPAQUE IOHEXOL 350 MG/ML SOLN COMPARISON:  None. FINDINGS: CT HEAD FINDINGS Brain: There is no mass, hemorrhage or extra-axial collection. The size and configuration of the ventricles  and extra-axial CSF spaces are normal. There is no acute or chronic infarction. The brain parenchyma is normal. Skull: The visualized skull base, calvarium and extracranial soft tissues are normal. Sinuses/Orbits: No fluid levels or advanced mucosal thickening of the visualized paranasal sinuses. No mastoid or middle ear effusion. The orbits are normal. CTA NECK FINDINGS SKELETON: There is no bony spinal canal stenosis. No lytic or blastic lesion. OTHER NECK: Normal pharynx, larynx and major salivary glands. No cervical lymphadenopathy. Unremarkable thyroid gland. UPPER CHEST: No pneumothorax or pleural effusion. No nodules or masses. AORTIC ARCH: There is no calcific atherosclerosis of the aortic arch. There is no aneurysm, dissection or hemodynamically significant  stenosis of the visualized portion of the aorta. Conventional 3 vessel aortic branching pattern. The visualized proximal subclavian arteries are widely patent. RIGHT CAROTID SYSTEM: Normal without aneurysm, dissection or stenosis. LEFT CAROTID SYSTEM: Normal without aneurysm, dissection or stenosis. VERTEBRAL ARTERIES: Left dominant configuration. Both origins are clearly patent. There is no dissection, occlusion or flow-limiting stenosis to the skull base (V1-V3 segments). CTA HEAD FINDINGS POSTERIOR CIRCULATION: --Vertebral arteries: Normal V4 segments. --Inferior cerebellar arteries: Normal. --Basilar artery: Normal. --Superior cerebellar arteries: Normal. --Posterior cerebral arteries (PCA): Normal. ANTERIOR CIRCULATION: --Intracranial internal carotid arteries: Normal. --Anterior cerebral arteries (ACA): Normal. Both A1 segments are present. Patent anterior communicating artery (a-comm). --Middle cerebral arteries (MCA): Normal. VENOUS SINUSES: As permitted by contrast timing, patent. ANATOMIC VARIANTS: None Review of the MIP images confirms the above findings. IMPRESSION: Normal CTA of the head and neck. Electronically Signed   By: Deatra Robinson M.D.   On: 05/10/2021 01:05   MR BRAIN WO CONTRAST  Result Date: 05/10/2021 CLINICAL DATA:  Neuro deficit, stroke suspected EXAM: MRI HEAD WITHOUT CONTRAST TECHNIQUE: Multiplanar, multiecho pulse sequences of the brain and surrounding structures were obtained without intravenous contrast. COMPARISON:  Same-day CT/CTA head and neck FINDINGS: Brain: There is no evidence of acute intracranial hemorrhage, extra-axial fluid collection, or acute infarct. Parenchymal volume is normal. The ventricles are normal in size. There are scattered foci of FLAIR signal abnormality in the subcortical and periventricular white matter likely reflecting sequela of mild chronic white matter microangiopathy. There is no suspicious parenchymal signal abnormality. There is no solid mass  lesion.  There is no midline shift. Vascular: Normal flow voids. Skull and upper cervical spine: Normal marrow signal. Sinuses/Orbits: There is mild mucosal thickening in the paranasal sinuses. The globes and orbits are unremarkable. Other: None. IMPRESSION: 1. No acute intracranial pathology. 2. Mild chronic white matter microangiopathy. Electronically Signed   By: Lesia Hausen M.D.   On: 05/10/2021 11:56      Fayrene Helper, PA-C 05/10/21 1403    Cathren Laine, MD 05/16/21 1010

## 2021-05-10 NOTE — ED Provider Notes (Signed)
3:11 AM Patient arrived to the ED from [facility] for further evaluation of dizziness.  MRI ordered to exclude central cause of vertigo.  6:19 AM Patient continues to pend MR imaging for further evaluation. Care signed out to [employer], PA-C at shift change.   Antony Madura, PA-C 05/10/21 [id number]    Nira Conn, MD 05/11/21 [id number]

## 2021-05-19 NOTE — Progress Notes (Deleted)
Cardiology Office Note:    Date:  05/19/2021   ID:  Catherine Goodwin, DOB [birth year], MRN [id number]  PCP:  Georganna Skeans, MD   [facility] Providers Cardiologist:  Alverda Skeans, MD Referring MD: Georganna Skeans, MD   Chief Complaint/Reason for Referral:  Hypertension and bradycardia    ASSESSMENT & PLAN:    Hypertension, unspecified type Would continue losartan and uptitrate this as needed.  I would avoid rate controlling agents.  Her sinus bradycardia is not causing her symptoms.  Certainly she may have an increased vagal tone given her nausea and vomiting which may be contributing.  Therefore vestibular rehabilitation and antinausea medications may be helpful in evaluating.  We will obtain an echocardiogram and keep follow-up with me open-ended.        {Are you ordering a CV Procedure (e.g. stress test, cath, DCCV, TEE, etc)?   Press F2        :[id number]}   Dispo:  No follow-ups on file.      Medication Adjustments/Labs and Tests Ordered: Current medicines are reviewed at length with the patient today.  Concerns regarding medicines are outlined above.    Tests Ordered: No orders of the defined types were placed in this encounter.    Medication Changes: No orders of the defined types were placed in this encounter.    History of Present Illness:    The patient is a 65 y.o. female with the indicated medical history here for recommendations regarding hypertension and bradycardia.  The patient was seen by her new primary care provider in November.  At that point in time her heart rate was 57 and her blood pressure was in the 140s.  She was seen in the emergency department shortly thereafter with dizziness, nausea, and vomiting..  She had a complete work-up including CT an MRI that were negative.  Vestibular rehab dilatation was recommended.      Previous Medical History: Past Medical History:  Diagnosis Date   Hypertension      Current Medications: No outpatient  medications have been marked as taking for the 05/20/21 encounter (Appointment) with Orbie Pyo, MD.     Allergies:    Patient has no known allergies.   Social History:   Social History   Tobacco Use   Smoking status: Never   Smokeless tobacco: Never  Vaping Use   Vaping Use: Never used  Substance Use Topics   Alcohol use: Never   Drug use: Never     Family Hx: No family history on file.   Review of Systems:   Please see the history of present illness.    All other systems reviewed and are negative.  EKGs/Labs/Other Test Reviewed:    EKG:  EKG today:***; prior EKG: Sinus bradycardia  Prior CV studies: Not yet performed  Imaging studies that I have independently reviewed today: CT and MRI  Recent Labs: 03/07/2021: ALT 18 05/09/2021: BUN 14; Creatinine, Ser 0.63; Hemoglobin 13.6; Platelets 279; Potassium 3.9; Sodium 138   Recent Lipid Panel Lab Results  Component Value Date/Time   CHOL 186 03/07/2021 10:05 AM   TRIG 127 03/07/2021 10:05 AM   HDL 60 03/07/2021 10:05 AM   LDLCALC 104 (H) 03/07/2021 10:05 AM     Risk Assessment/Calculations:    {Does this patient have ATRIAL FIBRILLATION?:[id number]}      Physical Exam:    VS:  There were no vitals taken for this visit.   Wt Readings from Last 3 Encounters:  05/09/21 126 lb (57.2  kg)  05/02/21 120 lb 9.6 oz (54.7 kg)  04/04/21 120 lb 9.6 oz (54.7 kg)    GENERAL:  No apparent distress, AOx3 HEENT:  No carotid bruits, +2 carotid impulses, no scleral icterus CAR: RRR Irregular RR*** no murmurs***, gallops, rubs, or thrills RES:  Clear to auscultation bilaterally ABD:  Soft, nontender, nondistended, positive bowel sounds x 4 VASC:  +2 radial pulses, +2 carotid pulses, palpable pedal pulses NEURO:  CN 2-12 grossly intact; motor and sensory grossly intact PSYCH:  No active depression or anxiety EXT:  No edema, ecchymosis, or cyanosis    Signed, Orbie Pyo, MD  05/19/2021 12:44 PM    [facility] [address] [place], [place], [place]  [address] Phone: [phone]; Fax: [phone]

## 2021-05-20 ENCOUNTER — Ambulatory Visit: Payer: 59 | Admitting: Internal Medicine

## 2021-05-20 DIAGNOSIS — I10 Essential (primary) hypertension: Secondary | ICD-10-CM

## 2021-05-30 ENCOUNTER — Ambulatory Visit: Payer: 59 | Admitting: Family Medicine

## 2021-05-30 ENCOUNTER — Other Ambulatory Visit: Payer: Self-pay

## 2021-05-30 ENCOUNTER — Encounter (INDEPENDENT_AMBULATORY_CARE_PROVIDER_SITE_OTHER): Payer: Self-pay

## 2021-05-30 ENCOUNTER — Encounter: Payer: Self-pay | Admitting: Family Medicine

## 2021-05-30 VITALS — BP 138/88 | HR 61 | Temp 98.5°F | Resp 16 | Wt 120.8 lb

## 2021-05-30 DIAGNOSIS — R001 Bradycardia, unspecified: Secondary | ICD-10-CM | POA: Diagnosis not present

## 2021-05-30 DIAGNOSIS — I10 Essential (primary) hypertension: Secondary | ICD-10-CM | POA: Diagnosis not present

## 2021-05-30 DIAGNOSIS — Z789 Other specified health status: Secondary | ICD-10-CM

## 2021-05-30 MED ORDER — LOSARTAN POTASSIUM 100 MG PO TABS: 100.0000 mg | ORAL_TABLET | Freq: Every day | ORAL | 0 refills | Status: DC

## 2021-05-30 NOTE — Progress Notes (Signed)
Established Patient Office Visit  Subjective:  Patient ID: Catherine Goodwin, female    DOB: 05-31-1956  Age: 65 y.o. MRN: [id number]  CC:  Chief Complaint  Patient presents with   Follow-up   Hypertension    HPI Catherine Goodwin presents for follow up of hypertension. Patient denies acute complaints. Is here with son who is not wanting to go to cardiology at this time for bradycardia as the hypertension has improved and a recent ekg at the hospital did not show anything acute. This visit was aided by an interpretor.   Past Medical History:  Diagnosis Date   Hypertension     No past surgical history on file.  No family history on file.  Social History   Socioeconomic History   Marital status: Married    Spouse name: Not on file   Number of children: Not on file   Years of education: Not on file   Highest education level: Not on file  Occupational History   Not on file  Tobacco Use   Smoking status: Never   Smokeless tobacco: Never  Vaping Use   Vaping Use: Never used  Substance and Sexual Activity   Alcohol use: Never   Drug use: Never   Sexual activity: Not on file  Other Topics Concern   Not on file  Social History Narrative   Not on file   Social Determinants of Health   Financial Resource Strain: Not on file  Food Insecurity: Not on file  Transportation Needs: Not on file  Physical Activity: Not on file  Stress: Not on file  Social Connections: Not on file  Intimate Partner Violence: Not on file    ROS Review of Systems  All other systems reviewed and are negative.  Objective:   Today's Vitals: BP 138/88    Pulse 61    Temp 98.5 F (36.9 C) (Oral)    Resp 16    Wt 120 lb 12.8 oz (54.8 kg)    SpO2 97%    BMI 23.59 kg/m   Physical Exam Vitals and nursing note reviewed.  Constitutional:      General: She is not in acute distress. Cardiovascular:     Rate and Rhythm: Normal rate and regular rhythm.  Pulmonary:     Effort: Pulmonary effort is normal.      Breath sounds: Normal breath sounds.  Abdominal:     Palpations: Abdomen is soft.     Tenderness: There is no abdominal tenderness.  Musculoskeletal:     Right lower leg: No edema.     Left lower leg: No edema.  Neurological:     General: No focal deficit present.     Mental Status: She is alert and oriented to person, place, and time.    Assessment & Plan:   1. Essential hypertension Improved reading. Continue present management and monitor. Meds refilled.   2. Bradycardia Patient appears to be asymptomatic and stable at this time. Patient/family has deferred referral to consultant at this time.   3. Language barrier to communication   Outpatient Encounter Medications as of 05/30/2021  Medication Sig   losartan (COZAAR) 100 MG tablet Take 1 tablet (100 mg total) by mouth daily.   meclizine (ANTIVERT) 25 MG tablet Take 1 tablet (25 mg total) by mouth 3 (three) times daily as needed for dizziness.   [DISCONTINUED] losartan (COZAAR) 100 MG tablet Take 1 tablet (100 mg total) by mouth daily.   No facility-administered encounter medications on file as  of 05/30/2021.    Follow-up: Return in about 3 months (around 08/28/2021) for follow up.   Tommie Raymond, MD

## 2021-05-30 NOTE — Progress Notes (Signed)
Patient is here for HTN f/u. Patient son has no knew concerns today. Patient is doing well.

## 2021-08-29 ENCOUNTER — Encounter (INDEPENDENT_AMBULATORY_CARE_PROVIDER_SITE_OTHER): Payer: Self-pay

## 2021-08-29 ENCOUNTER — Ambulatory Visit (INDEPENDENT_AMBULATORY_CARE_PROVIDER_SITE_OTHER): Payer: 59 | Admitting: Family Medicine

## 2021-08-29 ENCOUNTER — Encounter: Payer: Self-pay | Admitting: Family Medicine

## 2021-08-29 ENCOUNTER — Other Ambulatory Visit: Payer: Self-pay

## 2021-08-29 VITALS — BP 139/88 | HR 60 | Temp 98.0°F | Resp 16 | Wt 119.6 lb

## 2021-08-29 DIAGNOSIS — I10 Essential (primary) hypertension: Secondary | ICD-10-CM

## 2021-08-29 DIAGNOSIS — Z789 Other specified health status: Secondary | ICD-10-CM | POA: Diagnosis not present

## 2021-08-29 DIAGNOSIS — R42 Dizziness and giddiness: Secondary | ICD-10-CM | POA: Diagnosis not present

## 2021-08-29 MED ORDER — MECLIZINE HCL 25 MG PO TABS: 25.0000 mg | ORAL_TABLET | Freq: Three times a day (TID) | ORAL | 0 refills | Status: DC | PRN

## 2021-08-29 MED ORDER — MECLIZINE HCL 25 MG PO TABS: 25.0000 mg | ORAL_TABLET | Freq: Three times a day (TID) | ORAL | 1 refills | Status: AC | PRN

## 2021-08-29 MED ORDER — LOSARTAN POTASSIUM 100 MG PO TABS: 100.0000 mg | ORAL_TABLET | Freq: Every day | ORAL | 0 refills | Status: DC

## 2021-08-29 MED ORDER — LOSARTAN POTASSIUM 100 MG PO TABS: 100.0000 mg | ORAL_TABLET | Freq: Every day | ORAL | 1 refills | Status: AC

## 2021-08-29 NOTE — Progress Notes (Signed)
Patient has no concerns today. Patient said that everything is going well. ?

## 2021-08-29 NOTE — Progress Notes (Signed)
? ?  Established Patient Office Visit ? ?Subjective:  ?Patient ID: Catherine Goodwin, female    DOB: 05-02-1956  Age: 66 y.o. MRN: [id number] ? ?CC:  ?Chief Complaint  ?Patient presents with  ? Follow-up  ? ? ?HPI ?Catherine Goodwin presents for follow up of chronic med issues including hypertension. Patient denies acute complaints or concerns. This visit was aided by an interpreter.  ? ?Past Medical History:  ?Diagnosis Date  ? Hypertension   ? ? ?No past surgical history on file. ? ?No family history on file. ? ?Social History  ? ?Socioeconomic History  ? Marital status: Married  ?  Spouse name: Not on file  ? Number of children: Not on file  ? Years of education: Not on file  ? Highest education level: Not on file  ?Occupational History  ? Not on file  ?Tobacco Use  ? Smoking status: Never  ? Smokeless tobacco: Never  ?Vaping Use  ? Vaping Use: Never used  ?Substance and Sexual Activity  ? Alcohol use: Never  ? Drug use: Never  ? Sexual activity: Not on file  ?Other Topics Concern  ? Not on file  ?Social History Narrative  ? Not on file  ? ?Social Determinants of Health  ? ?Financial Resource Strain: Not on file  ?Food Insecurity: Not on file  ?Transportation Needs: Not on file  ?Physical Activity: Not on file  ?Stress: Not on file  ?Social Connections: Not on file  ?Intimate Partner Violence: Not on file  ? ? ?ROS ?Review of Systems  ?All other systems reviewed and are negative. ? ?Objective:  ? ?Today's Vitals: BP 139/88   Pulse 60   Temp 98 ?F (36.7 ?C) (Oral)   Resp 16   Wt 119 lb 9.6 oz (54.3 kg)   BMI 23.36 kg/m?  ? ?Physical Exam ?Vitals and nursing note reviewed.  ?Constitutional:   ?   General: She is not in acute distress. ?Cardiovascular:  ?   Rate and Rhythm: Normal rate and regular rhythm.  ?Pulmonary:  ?   Effort: Pulmonary effort is normal.  ?   Breath sounds: Normal breath sounds.  ?Abdominal:  ?   Palpations: Abdomen is soft.  ?   Tenderness: There is no abdominal tenderness.  ?Musculoskeletal:  ?   Right  lower leg: No edema.  ?   Left lower leg: No edema.  ?Neurological:  ?   General: No focal deficit present.  ?   Mental Status: She is alert and oriented to person, place, and time.  ? ? ?Assessment & Plan:  ?1. Essential hypertension ?Appears stable with present management. Continue and monitor.  Meds refilled.  ? ?2. Language barrier to communication ? ? ?3. Dizziness and giddiness ?Continue meclizine prn. Meds refilled ? ? ?Outpatient Encounter Medications as of 08/29/2021  ?Medication Sig  ? losartan (COZAAR) 100 MG tablet Take 1 tablet (100 mg total) by mouth daily.  ? meclizine (ANTIVERT) 25 MG tablet Take 1 tablet (25 mg total) by mouth 3 (three) times daily as needed for dizziness. (Patient not taking: Reported on 08/29/2021)  ? ?No facility-administered encounter medications on file as of 08/29/2021.  ? ? ?Follow-up: No follow-ups on file.  ? ?Tommie Raymond, MD ? ?

## 2022-02-28 ENCOUNTER — Ambulatory Visit: Payer: Self-pay | Admitting: Family Medicine

## 2022-11-17 IMAGING — MR MR HEAD W/O CM
9 of 10 series · 36 of 48 positions shown · non-contrast
Comparison: Same-day CT/CTA head and neck

CLINICAL DATA: Neuro deficit, stroke suspected

EXAM:
MRI HEAD WITHOUT CONTRAST
TECHNIQUE: Multiplanar, multiecho pulse sequences of the brain and surrounding
structures were obtained without intravenous contrast.

[Series 3: DWI · axial · 3.0mm · 1.09mm/px · z∈[-54,+95]mm · 8 of 102 slices shown (1 of 4)]
[im 1/102]
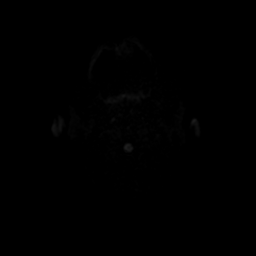
[im 12/102]
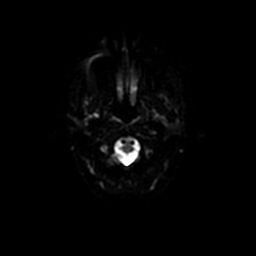
[im 34/102]
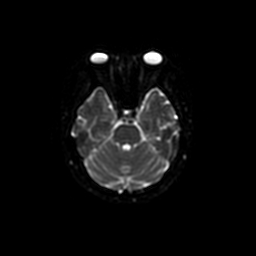
[im 45/102]
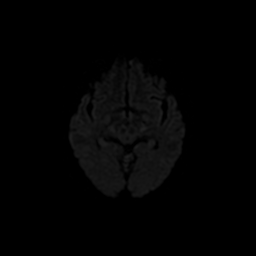
[im 57/102]
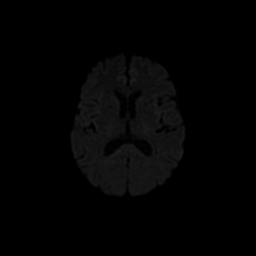
[im 68/102]
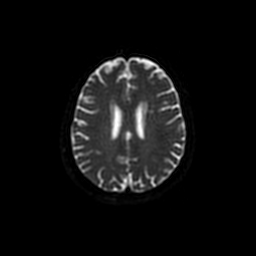
[im 90/102]
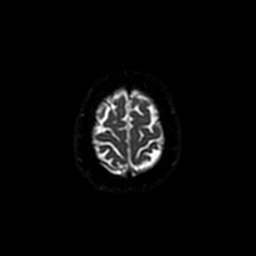
[im 102/102]
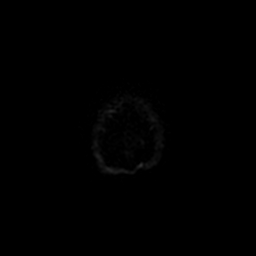

[Series 4: DWI · coronal · 5.0mm · 1.09mm/px · 7 of 70 slices shown (2 of 4)]
[im 1/70]
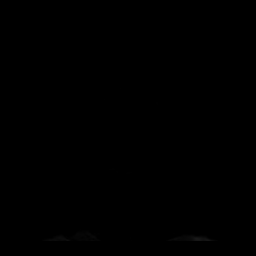
[im 12/70]
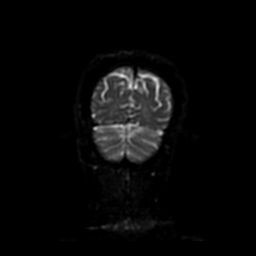
[im 24/70]
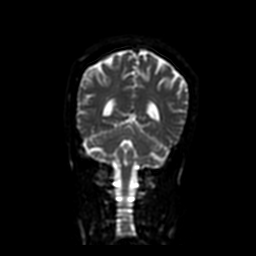
[im 35/70]
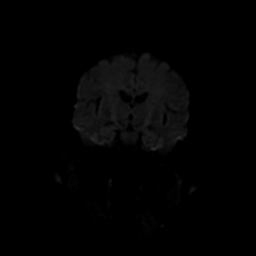
[im 47/70]
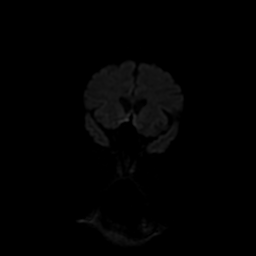
[im 58/70]
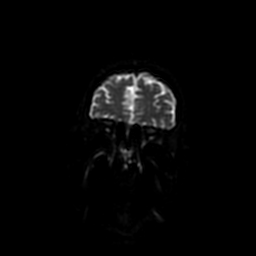
[im 70/70]
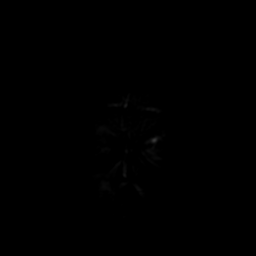

[Series 5: T1 · sagittal · 5.0mm · 0.47mm/px · 2 of 21 slices shown (1 of 2)]
[im 1/21]
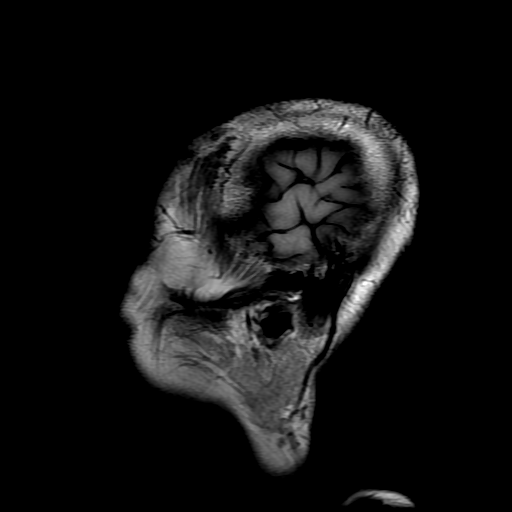
[im 21/21]
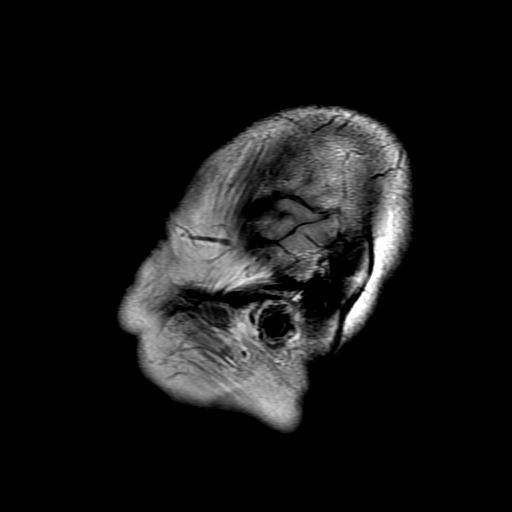

[Series 6: T2 · axial · 5.0mm · 0.43mm/px · z∈[-65,+79]mm · 3 of 25 slices shown (1 of 2)]
[im 1/25]
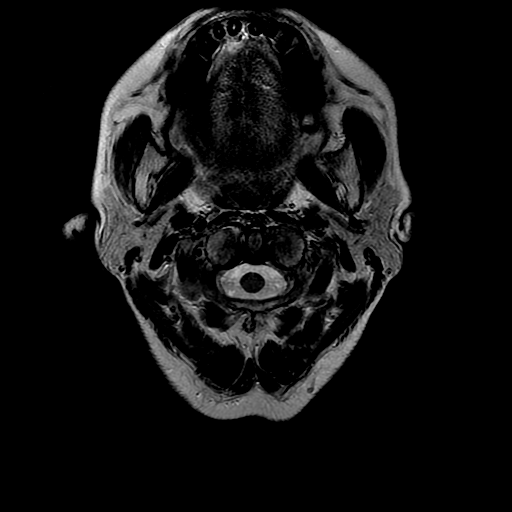
[im 13/25]
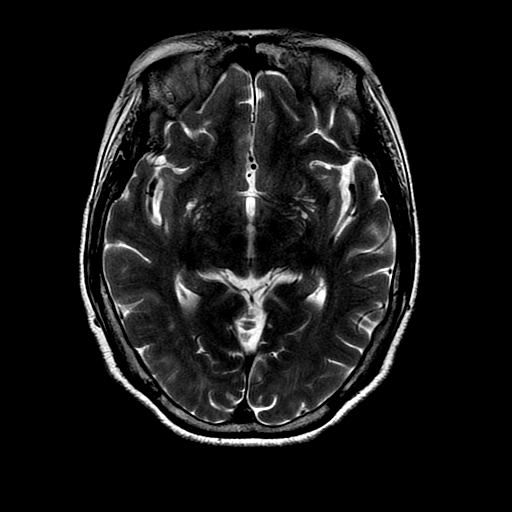
[im 25/25]
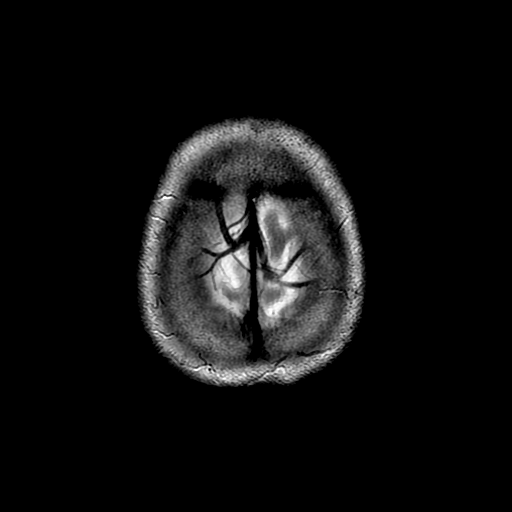

[Series 7: FLAIR · axial · 3.0mm · 0.43mm/px · z∈[-65,+79]mm · 3 of 25 slices shown]
[im 1/25]
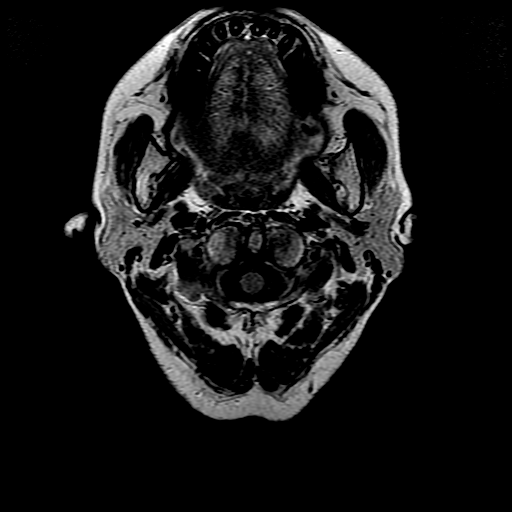
[im 13/25]
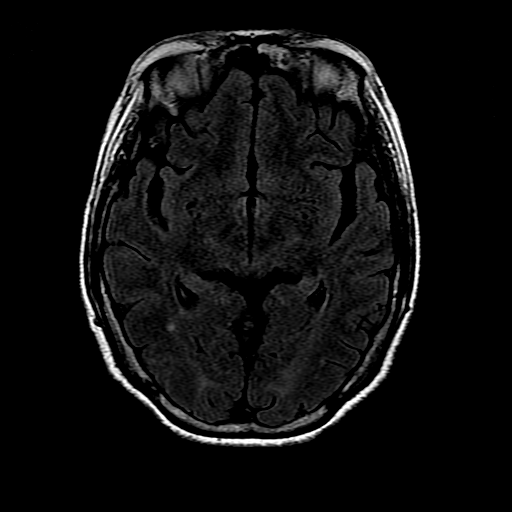
[im 25/25]
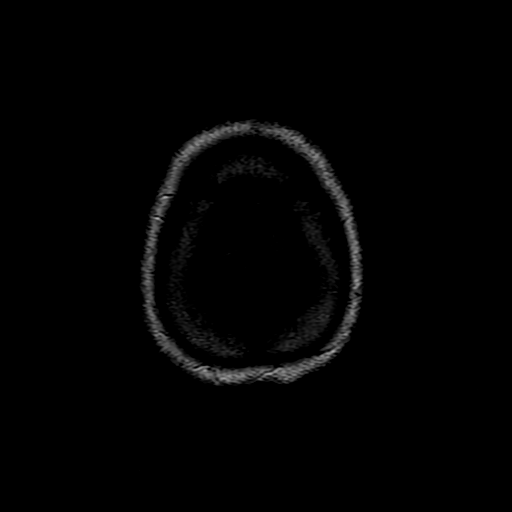

[Series 9: T1 · axial · 3.0mm · 0.43mm/px · z∈[-66,-50]mm · 2 of 100 slices shown (2 of 2)]
[im 1/100]
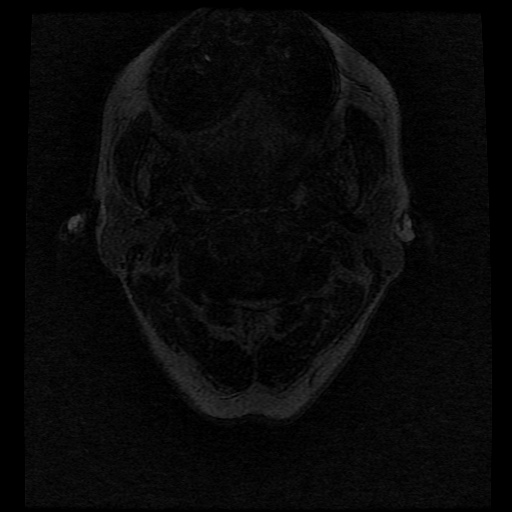
[im 12/100]
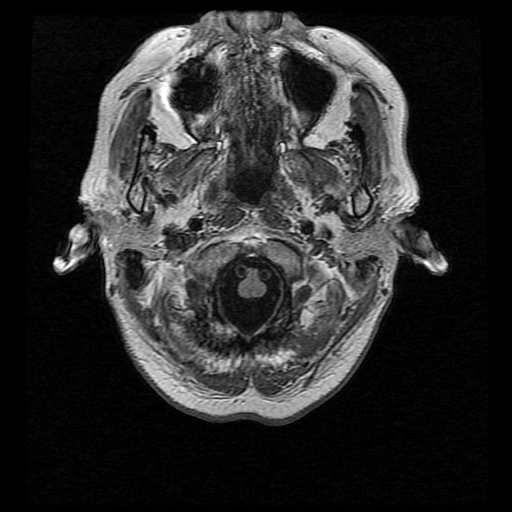

[Series 10: T2 · coronal · 5.0mm · 0.39mm/px · 2 of 24 slices shown (2 of 2)]
[im 1/24]
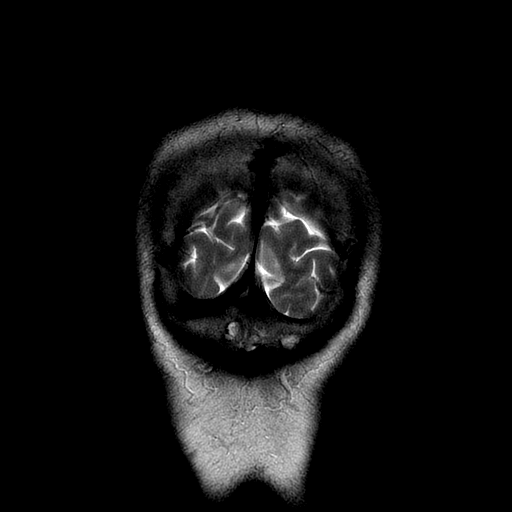
[im 24/24]
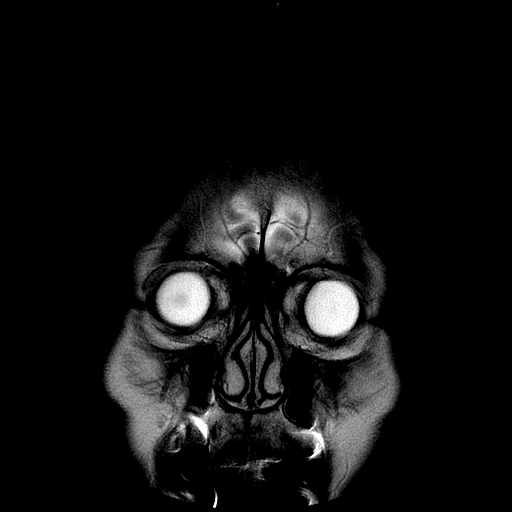

[Series 300: DWI · axial · 3.0mm · 1.09mm/px · z∈[-54,+95]mm · 5 of 51 slices shown (3 of 4)]
[im 1/51]
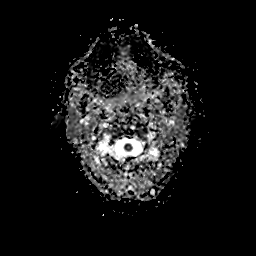
[im 13/51]
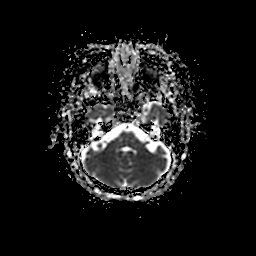
[im 26/51]
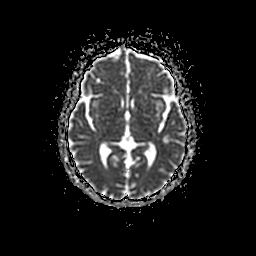
[im 38/51]
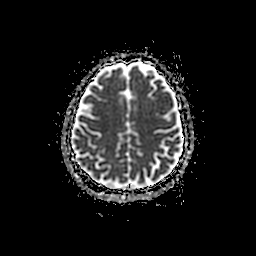
[im 51/51]
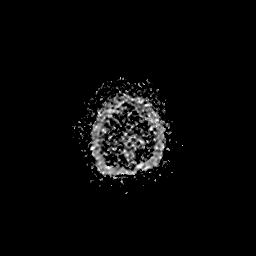

[Series 400: DWI · coronal · 5.0mm · 1.09mm/px · 4 of 35 slices shown (4 of 4)]
[im 1/35]
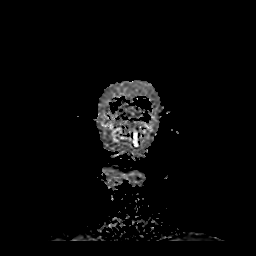
[im 12/35]
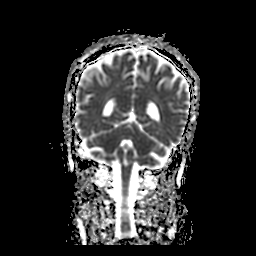
[im 23/35]
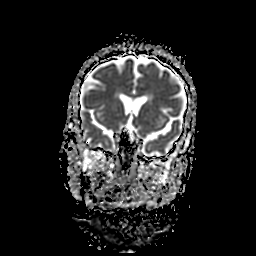
[im 35/35]
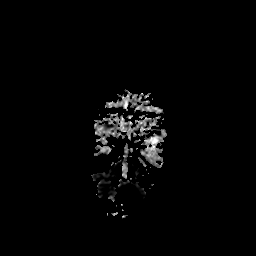

[36 of 48 positions shown; findings below may reference images not displayed]

FINDINGS: Brain: There is no evidence of acute intracranial hemorrhage,
extra-axial fluid collection, or acute infarct.

Parenchymal volume is normal. The ventricles are normal in size.
There are scattered foci of FLAIR signal abnormality in the
subcortical and periventricular white matter likely reflecting
sequela of mild chronic white matter microangiopathy. There is no
suspicious parenchymal signal abnormality.

There is no solid mass lesion.  There is no midline shift.

Vascular: Normal flow voids.

Skull and upper cervical spine: Normal marrow signal.

Sinuses/Orbits: There is mild mucosal thickening in the paranasal
sinuses. The globes and orbits are unremarkable.

Other: None.
IMPRESSION: 1. No acute intracranial pathology.
2. Mild chronic white matter microangiopathy.

## 2022-11-17 IMAGING — CT CT ANGIO HEAD-NECK (W OR W/O PERF)
1 of 11 series · 5 of 33 positions shown · IV contrast (omnipaque)
Comparison: None.

CLINICAL DATA: Trauma



[Series 11: ax thin · axial · 0.62mm/px · z∈[-223,+13]mm · 5 of 355 slices shown]
[im 60/355  soft-tissue]
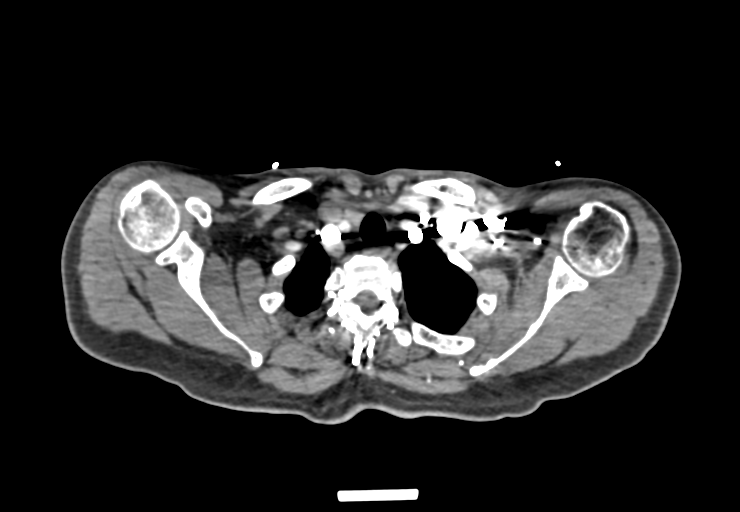
[im 119/355  bone]
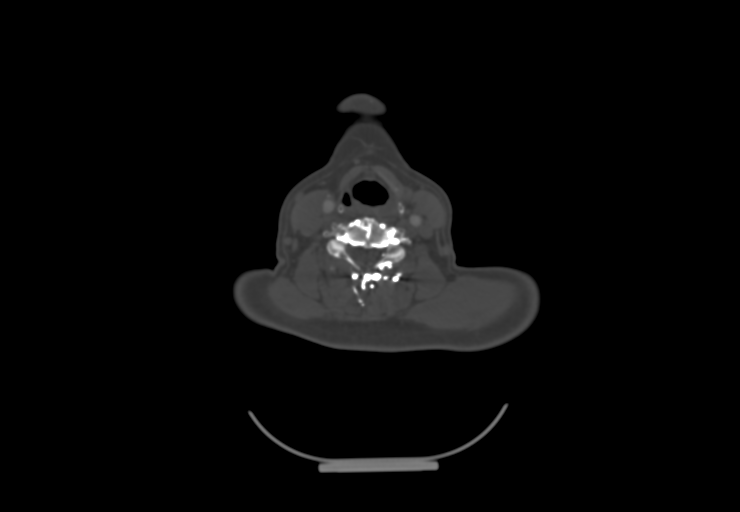
[im 178/355  soft-tissue]
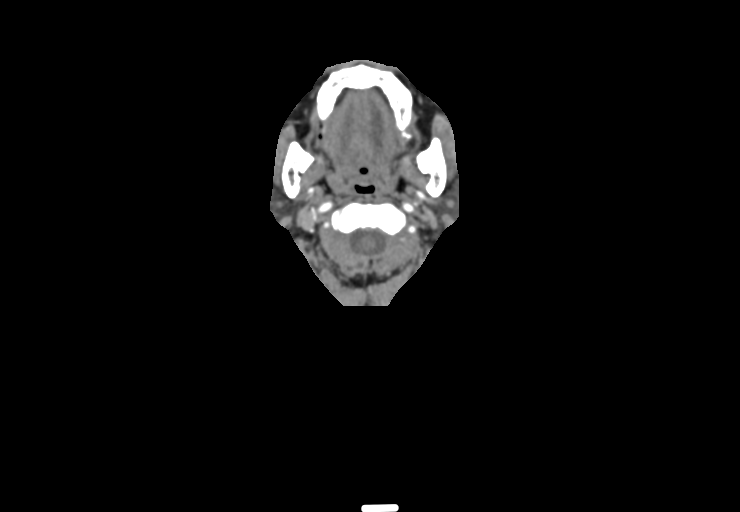
[im 237/355  bone]
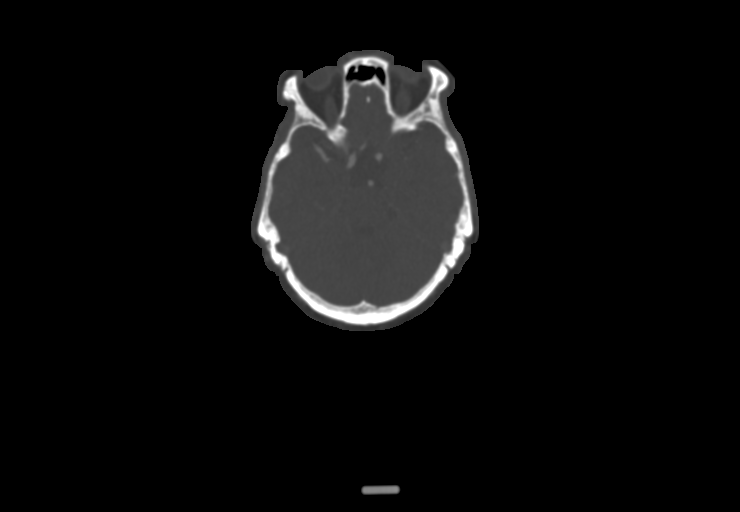
[im 296/355  soft-tissue]
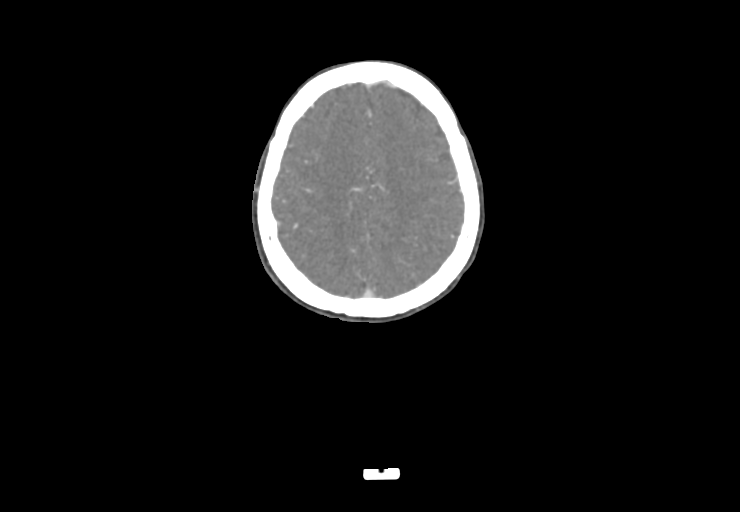

[5 of 33 positions shown; findings below may reference images not displayed]

FINDINGS: CT HEAD FINDINGS

Brain: There is no mass, hemorrhage or extra-axial collection. The
size and configuration of the ventricles and extra-axial CSF spaces
are normal. There is no acute or chronic infarction. The brain
parenchyma is normal.

Skull: The visualized skull base, calvarium and extracranial soft
tissues are normal.

Sinuses/Orbits: No fluid levels or advanced mucosal thickening of
the visualized paranasal sinuses. No mastoid or middle ear effusion.
The orbits are normal.

CTA NECK FINDINGS

SKELETON: There is no bony spinal canal stenosis. No lytic or
blastic lesion.

OTHER NECK: Normal pharynx, larynx and major salivary glands. No
cervical lymphadenopathy. Unremarkable thyroid gland.

UPPER CHEST: No pneumothorax or pleural effusion. No nodules or
masses.

AORTIC ARCH:

There is no calcific atherosclerosis of the aortic arch. There is no
aneurysm, dissection or hemodynamically significant stenosis of the
visualized portion of the aorta. Conventional 3 vessel aortic
branching pattern. The visualized proximal subclavian arteries are
widely patent.

RIGHT CAROTID SYSTEM: Normal without aneurysm, dissection or
stenosis.

LEFT CAROTID SYSTEM: Normal without aneurysm, dissection or
stenosis.

VERTEBRAL ARTERIES: Left dominant configuration. Both origins are
clearly patent. There is no dissection, occlusion or flow-limiting
stenosis to the skull base (V1-V3 segments).

CTA HEAD FINDINGS

POSTERIOR CIRCULATION:

--Vertebral arteries: Normal V4 segments.

--Inferior cerebellar arteries: Normal.

--Basilar artery: Normal.

--Superior cerebellar arteries: Normal.

--Posterior cerebral arteries (PCA): Normal.

ANTERIOR CIRCULATION:

--Intracranial internal carotid arteries: Normal.

--Anterior cerebral arteries (ACA): Normal. Both A1 segments are
present. Patent anterior communicating artery (a-comm).

--Middle cerebral arteries (MCA): Normal.

VENOUS SINUSES: As permitted by contrast timing, patent.

ANATOMIC VARIANTS: None

Review of the MIP images confirms the above findings.
IMPRESSION: Normal CTA of the head and neck.
